# Patient Record
Sex: Female | Born: 1945 | Race: White | Hispanic: Yes | Marital: Married | Smoking: Never smoker
Health system: Southern US, Community
[De-identification: ages and names within clinical notes are randomized; demographics above are authoritative.]

## PROBLEM LIST (undated history)

## (undated) DIAGNOSIS — E119 Type 2 diabetes mellitus without complications: Secondary | ICD-10-CM

---

## 2017-12-07 ENCOUNTER — Emergency Department (HOSPITAL_COMMUNITY): Payer: Medicare (Managed Care) | Admitting: Anesthesiology

## 2017-12-07 ENCOUNTER — Encounter (HOSPITAL_COMMUNITY): Payer: Self-pay

## 2017-12-07 ENCOUNTER — Other Ambulatory Visit: Payer: Self-pay

## 2017-12-07 ENCOUNTER — Emergency Department (HOSPITAL_COMMUNITY): Payer: Medicare (Managed Care)

## 2017-12-07 ENCOUNTER — Encounter (HOSPITAL_COMMUNITY)
Admission: EM | Disposition: A | Discharge status: Home / Self Care | Payer: Self-pay | Source: Home / Self Care | Attending: Emergency Medicine

## 2017-12-07 ENCOUNTER — Ambulatory Visit (HOSPITAL_COMMUNITY)
Admission: EM | Admit: 2017-12-07 | Discharge: 2017-12-07 | Disposition: A | Discharge status: Home / Self Care | Payer: Medicare (Managed Care) | Attending: Emergency Medicine | Admitting: Emergency Medicine

## 2017-12-07 DIAGNOSIS — E119 Type 2 diabetes mellitus without complications: Secondary | ICD-10-CM | POA: Diagnosis not present

## 2017-12-07 DIAGNOSIS — Z7984 Long term (current) use of oral hypoglycemic drugs: Secondary | ICD-10-CM | POA: Insufficient documentation

## 2017-12-07 DIAGNOSIS — I10 Essential (primary) hypertension: Secondary | ICD-10-CM | POA: Insufficient documentation

## 2017-12-07 DIAGNOSIS — W2103XA Struck by baseball, initial encounter: Secondary | ICD-10-CM | POA: Insufficient documentation

## 2017-12-07 DIAGNOSIS — S01511A Laceration without foreign body of lip, initial encounter: Secondary | ICD-10-CM | POA: Diagnosis not present

## 2017-12-07 DIAGNOSIS — Y9382 Activity, spectator at an event: Secondary | ICD-10-CM | POA: Diagnosis not present

## 2017-12-07 DIAGNOSIS — S02401A Maxillary fracture, unspecified, initial encounter for closed fracture: Secondary | ICD-10-CM

## 2017-12-07 DIAGNOSIS — Y929 Unspecified place or not applicable: Secondary | ICD-10-CM | POA: Diagnosis not present

## 2017-12-07 HISTORY — PX: FACIAL LACERATION REPAIR: SHX6589

## 2017-12-07 HISTORY — DX: Type 2 diabetes mellitus without complications: E11.9

## 2017-12-07 LAB — CBC
HEMATOCRIT: 36.7 % (ref 36.0–46.0)
HEMOGLOBIN: 11.6 g/dL — AB (ref 12.0–15.0)
MCH: 28.3 pg (ref 26.0–34.0)
MCHC: 31.6 g/dL (ref 30.0–36.0)
MCV: 89.5 fL (ref 78.0–100.0)
Platelets: 162 10*3/uL (ref 150–400)
RBC: 4.1 MIL/uL (ref 3.87–5.11)
RDW: 13.5 % (ref 11.5–15.5)
WBC: 7.1 10*3/uL (ref 4.0–10.5)

## 2017-12-07 LAB — BASIC METABOLIC PANEL
Anion gap: 10 (ref 5–15)
BUN: 16 mg/dL (ref 8–23)
CHLORIDE: 102 mmol/L (ref 98–111)
CO2: 26 mmol/L (ref 22–32)
CREATININE: 1.02 mg/dL — AB (ref 0.44–1.00)
Calcium: 9.2 mg/dL (ref 8.9–10.3)
GFR calc non Af Amer: 53 mL/min — ABNORMAL LOW (ref >60)
GLUCOSE: 208 mg/dL — AB (ref 70–99)
Potassium: 3.3 mmol/L — ABNORMAL LOW (ref 3.5–5.1)
Sodium: 138 mmol/L (ref 135–145)

## 2017-12-07 LAB — GLUCOSE, CAPILLARY: GLUCOSE-CAPILLARY: 138 mg/dL — AB (ref 70–99)

## 2017-12-07 SURGERY — REPAIR, LACERATION, FACE
Anesthesia: General

## 2017-12-07 MED ORDER — PROPOFOL 10 MG/ML IV BOLUS
INTRAVENOUS | Status: DC | PRN
  Administered 2017-12-07: 20 mg via INTRAVENOUS
  Administered 2017-12-07: 110 mg via INTRAVENOUS

## 2017-12-07 MED ORDER — FENTANYL CITRATE (PF) 250 MCG/5ML IJ SOLN
INTRAMUSCULAR | Status: AC
  Filled 2017-12-07: qty 5

## 2017-12-07 MED ORDER — MIDAZOLAM HCL 5 MG/5ML IJ SOLN
INTRAMUSCULAR | Status: DC | PRN
  Administered 2017-12-07: 2 mg via INTRAVENOUS

## 2017-12-07 MED ORDER — ONDANSETRON HCL 4 MG/2ML IJ SOLN: 4.0000 mg | Freq: Once | INTRAMUSCULAR | Status: DC | PRN

## 2017-12-07 MED ORDER — LACTATED RINGERS IV SOLN
INTRAVENOUS | Status: DC | PRN
  Administered 2017-12-07: 22:00:00 via INTRAVENOUS

## 2017-12-07 MED ORDER — ONDANSETRON HCL 4 MG/2ML IJ SOLN
INTRAMUSCULAR | Status: DC | PRN
  Administered 2017-12-07: 4 mg via INTRAVENOUS

## 2017-12-07 MED ORDER — SUCCINYLCHOLINE CHLORIDE 20 MG/ML IJ SOLN
INTRAMUSCULAR | Status: DC | PRN
  Administered 2017-12-07: 140 mg via INTRAVENOUS

## 2017-12-07 MED ORDER — SUCCINYLCHOLINE CHLORIDE 200 MG/10ML IV SOSY
PREFILLED_SYRINGE | INTRAVENOUS | Status: AC
  Filled 2017-12-07: qty 10

## 2017-12-07 MED ORDER — LIDOCAINE HCL (CARDIAC) PF 100 MG/5ML IV SOSY
PREFILLED_SYRINGE | INTRAVENOUS | Status: DC | PRN
  Administered 2017-12-07: 100 mg via INTRAVENOUS

## 2017-12-07 MED ORDER — HYDROMORPHONE HCL 1 MG/ML IJ SOLN: 0.2500 mg | INTRAMUSCULAR | Status: DC | PRN

## 2017-12-07 MED ORDER — MORPHINE SULFATE (PF) 4 MG/ML IV SOLN
4.0000 mg | INTRAVENOUS | Status: DC | PRN
Start: 2017-12-07 — End: 2017-12-08

## 2017-12-07 MED ORDER — LIDOCAINE-EPINEPHRINE 1 %-1:100000 IJ SOLN
INTRAMUSCULAR | Status: AC
  Filled 2017-12-07: qty 1

## 2017-12-07 MED ORDER — SODIUM CHLORIDE 0.9 % IV SOLN
INTRAVENOUS | Status: DC | PRN
  Administered 2017-12-07: 50 ug/min via INTRAVENOUS

## 2017-12-07 MED ORDER — BACITRACIN ZINC 500 UNIT/GM EX OINT
TOPICAL_OINTMENT | CUTANEOUS | Status: DC | PRN
  Administered 2017-12-07: 1 via TOPICAL

## 2017-12-07 MED ORDER — FENTANYL CITRATE (PF) 100 MCG/2ML IJ SOLN
INTRAMUSCULAR | Status: DC | PRN
  Administered 2017-12-07: 50 ug via INTRAVENOUS
  Administered 2017-12-07: 100 ug via INTRAVENOUS

## 2017-12-07 MED ORDER — CEFAZOLIN SODIUM-DEXTROSE 2-3 GM-%(50ML) IV SOLR
INTRAVENOUS | Status: DC | PRN
  Administered 2017-12-07: 2 g via INTRAVENOUS

## 2017-12-07 MED ORDER — MEPERIDINE HCL 50 MG/ML IJ SOLN: 6.2500 mg | INTRAMUSCULAR | Status: DC | PRN

## 2017-12-07 MED ORDER — LIDOCAINE 2% (20 MG/ML) 5 ML SYRINGE
INTRAMUSCULAR | Status: AC
  Filled 2017-12-07: qty 5

## 2017-12-07 MED ORDER — EPHEDRINE SULFATE 50 MG/ML IJ SOLN
INTRAMUSCULAR | Status: AC
  Filled 2017-12-07: qty 1

## 2017-12-07 MED ORDER — ONDANSETRON HCL 4 MG/2ML IJ SOLN
INTRAMUSCULAR | Status: AC
  Filled 2017-12-07: qty 2

## 2017-12-07 MED ORDER — OXYCODONE HCL 5 MG PO TABS
ORAL_TABLET | ORAL | Status: AC
  Filled 2017-12-07: qty 1

## 2017-12-07 MED ORDER — CEFAZOLIN SODIUM-DEXTROSE 2-4 GM/100ML-% IV SOLN
INTRAVENOUS | Status: AC
  Filled 2017-12-07: qty 100

## 2017-12-07 MED ORDER — EPHEDRINE SULFATE 50 MG/ML IJ SOLN
INTRAMUSCULAR | Status: DC | PRN
  Administered 2017-12-07: 5 mg via INTRAVENOUS

## 2017-12-07 MED ORDER — BACITRACIN ZINC 500 UNIT/GM EX OINT
TOPICAL_OINTMENT | CUTANEOUS | Status: AC
Start: 2017-12-07 — End: ?
  Filled 2017-12-07: qty 28.35

## 2017-12-07 MED ORDER — OXYCODONE HCL 5 MG PO TABS
5.0000 mg | ORAL_TABLET | Freq: Once | ORAL | Status: AC
Start: 2017-12-07 — End: 2017-12-07
  Administered 2017-12-07: 5 mg via ORAL

## 2017-12-07 MED ORDER — MIDAZOLAM HCL 2 MG/2ML IJ SOLN
INTRAMUSCULAR | Status: AC
  Filled 2017-12-07: qty 2

## 2017-12-07 MED ORDER — PROPOFOL 10 MG/ML IV BOLUS
INTRAVENOUS | Status: AC
  Filled 2017-12-07: qty 20

## 2017-12-07 SURGICAL SUPPLY — 40 items
BLADE SURG 15 STRL LF DISP TIS (BLADE) IMPLANT
BLADE SURG 15 STRL SS (BLADE)
CANISTER SUCT 3000ML PPV (MISCELLANEOUS) ×2 IMPLANT
CLEANER TIP ELECTROSURG 2X2 (MISCELLANEOUS) ×2 IMPLANT
CORDS BIPOLAR (ELECTRODE) IMPLANT
COVER SURGICAL LIGHT HANDLE (MISCELLANEOUS) ×2 IMPLANT
DRAPE HALF SHEET 40X57 (DRAPES) ×2 IMPLANT
DRAPE U-SHAPE 76X120 STRL (DRAPES) ×2 IMPLANT
ELECT COATED BLADE 2.86 ST (ELECTRODE) ×2 IMPLANT
ELECT NEEDLE TIP 2.8 STRL (NEEDLE) IMPLANT
ELECT REM PT RETURN 9FT ADLT (ELECTROSURGICAL) ×2
ELECTRODE REM PT RTRN 9FT ADLT (ELECTROSURGICAL) ×1 IMPLANT
EVACUATOR SILICONE 100CC (DRAIN) IMPLANT
GLOVE BIOGEL M 7.0 STRL (GLOVE) ×2 IMPLANT
GOWN STRL REUS W/ TWL LRG LVL3 (GOWN DISPOSABLE) ×2 IMPLANT
GOWN STRL REUS W/TWL LRG LVL3 (GOWN DISPOSABLE) ×2
KIT BASIN OR (CUSTOM PROCEDURE TRAY) ×2 IMPLANT
KIT TURNOVER KIT B (KITS) ×2 IMPLANT
LOCATOR NERVE 3 VOLT (DISPOSABLE) IMPLANT
NEEDLE HYPO 25GX1X1/2 BEV (NEEDLE) IMPLANT
NS IRRIG 1000ML POUR BTL (IV SOLUTION) ×2 IMPLANT
PAD ARMBOARD 7.5X6 YLW CONV (MISCELLANEOUS) ×4 IMPLANT
PENCIL BUTTON HOLSTER BLD 10FT (ELECTRODE) ×2 IMPLANT
SUT CHROMIC 4 0 P 3 18 (SUTURE) ×4 IMPLANT
SUT ETHILON 3 0 PS 1 (SUTURE) IMPLANT
SUT ETHILON 5 0 P 3 18 (SUTURE)
SUT ETHILON 5 0 PS 2 18 (SUTURE) IMPLANT
SUT ETHILON 6 0 P 1 (SUTURE) ×2 IMPLANT
SUT NYLON ETHILON 5-0 P-3 1X18 (SUTURE) IMPLANT
SUT PLAIN 5 0 P 3 18 (SUTURE) IMPLANT
SUT SILK 2 0 PERMA HAND 18 BK (SUTURE) IMPLANT
SUT SILK 3 0 REEL (SUTURE) IMPLANT
SUT VIC AB 3-0 PS2 18 (SUTURE)
SUT VIC AB 3-0 PS2 18XBRD (SUTURE) IMPLANT
SUT VIC AB 4-0 P-3 18X BRD (SUTURE) ×1 IMPLANT
SUT VIC AB 4-0 P3 18 (SUTURE) ×1
SUT VICRYL ABS 5-0 PS5 18IN (SUTURE) IMPLANT
TOWEL OR 17X24 6PK STRL BLUE (TOWEL DISPOSABLE) ×2 IMPLANT
TRAY ENT MC OR (CUSTOM PROCEDURE TRAY) ×2 IMPLANT
WATER STERILE IRR 1000ML POUR (IV SOLUTION) ×2 IMPLANT

## 2017-12-07 NOTE — Discharge Instructions (Signed)
Laceration Care, Adult A laceration is a cut that goes through all of the layers of the skin and into the tissue that is right under the skin. Some lacerations heal on their own. Others need to be closed with stitches (sutures), staples, skin adhesive strips, or skin glue. Proper laceration care minimizes the risk of infection and helps the laceration to heal better. How is this treated? If sutures or staples were used:  Keep the wound clean and dry.  If you were given a bandage (dressing), you should change it at least one time per day or as told by your health care provider. You should also change it if it becomes wet or dirty.  Keep the wound completely dry for the first 24 hours or as told by your health care provider. After that time, you may shower or bathe. However, make sure that the wound is not soaked in water until after the sutures or staples have been removed.  Clean the wound one time each day or as told by your health care provider: ? Wash the wound with soap and water. ? Rinse the wound with water to remove all soap. ? Pat the wound dry with a clean towel. Do not rub the wound.  After cleaning the wound, apply a thin layer of antibiotic ointmentas told by your health care provider. This will help to prevent infection and keep the dressing from sticking to the wound.  Have the sutures or staples removed as told by your health care provider. If skin adhesive strips were used:  Keep the wound clean and dry.  If you were given a bandage (dressing), you should change it at least one time per day or as told by your health care provider. You should also change it if it becomes dirty or wet.  Do not get the skin adhesive strips wet. You may shower or bathe, but be careful to keep the wound dry.  If the wound gets wet, pat it dry with a clean towel. Do not rub the wound.  Skin adhesive strips fall off on their own. You may trim the strips as the wound heals. Do not remove  skin adhesive strips that are still stuck to the wound. They will fall off in time. If skin glue was used:  Try to keep the wound dry, but you may briefly wet it in the shower or bath. Do not soak the wound in water, such as by swimming.  After you have showered or bathed, gently pat the wound dry with a clean towel. Do not rub the wound.  Do not do any activities that will make you sweat heavily until the skin glue has fallen off on its own.  Do not apply liquid, cream, or ointment medicine to the wound while the skin glue is in place. Using those may loosen the film before the wound has healed.  If you were given a bandage (dressing), you should change it at least one time per day or as told by your health care provider. You should also change it if it becomes dirty or wet.  If a dressing is placed over the wound, be careful not to apply tape directly over the skin glue. Doing that may cause the glue to be pulled off before the wound has healed.  Do not pick at the glue. The skin glue usually remains in place for 5-10 days, then it falls off of the skin. General Instructions  Take over-the-counter and prescription medicines  only as told by your health care provider.  If you were prescribed an antibiotic medicine or ointment, take or apply it as told by your doctor. Do not stop using it even if your condition improves.  To help prevent scarring, make sure to cover your wound with sunscreen whenever you are outside after stitches are removed, after adhesive strips are removed, or when glue remains in place and the wound is healed. Make sure to wear a sunscreen of at least 30 SPF.  Do not scratch or pick at the wound.  Keep all follow-up visits as told by your health care provider. This is important.  Check your wound every day for signs of infection. Watch for: ? Redness, swelling, or pain. ? Fluid, blood, or pus.  Raise (elevate) the injured area above the level of your heart while  you are sitting or lying down, if possible. Contact a health care provider if:  You received a tetanus shot and you have swelling, severe pain, redness, or bleeding at the injection site.  You have a fever.  A wound that was closed breaks open.  You notice a bad smell coming from your wound or your dressing.  You notice something coming out of the wound, such as wood or glass.  Your pain is not controlled with medicine.  You have increased redness, swelling, or pain at the site of your wound.  You have fluid, blood, or pus coming from your wound.  You notice a change in the color of your skin near your wound.  You need to change the dressing frequently due to fluid, blood, or pus draining from the wound.  You develop a new rash.  You develop numbness around the wound. Get help right away if:  You develop severe swelling around the wound.  Your pain suddenly increases and is severe.  You develop painful lumps near the wound or on skin that is anywhere on your body.  You have a red streak going away from your wound.  The wound is on your hand or foot and you cannot properly move a finger or toe.  The wound is on your hand or foot and you notice that your fingers or toes look pale or bluish. This information is not intended to replace advice given to you by your health care provider. Make sure you discuss any questions you have with your health care provider. Document Released: 04/28/2005 Document Revised: 09/28/2015 Document Reviewed: 04/24/2014 Elsevier Interactive Patient Education  2018 [employer].  Post Anesthesia Home Care Instructions  Activity: Get plenty of rest for the remainder of the day. A responsible individual must stay with you for 24 hours following the procedure.  For the next 24 hours, DO NOT: -Drive a car -Advertising copywriter -Drink alcoholic beverages -Take any medication unless instructed by your physician -Make any legal decisions or sign  important papers.  Meals: Start with liquid foods such as gelatin or soup. Progress to regular foods as tolerated. Avoid greasy, spicy, heavy foods. If nausea and/or vomiting occur, drink only clear liquids until the nausea and/or vomiting subsides. Call your physician if vomiting continues.  Special Instructions/Symptoms: Your throat may feel dry or sore from the anesthesia or the breathing tube placed in your throat during surgery. If this causes discomfort, gargle with warm salt water. The discomfort should disappear within 24 hours.  If you had a scopolamine patch placed behind your ear for the management of post- operative nausea and/or vomiting:  1. The medication  in the patch is effective for 72 hours, after which it should be removed.  Wrap patch in a tissue and discard in the trash. Wash hands thoroughly with soap and water. 2. You may remove the patch earlier than 72 hours if you experience unpleasant side effects which may include dry mouth, dizziness or visual disturbances. 3. Avoid touching the patch. Wash your hands with soap and water after contact with the patch.

## 2017-12-07 NOTE — ED Triage Notes (Signed)
Patient here by EMS for a facial injury with a baseball.  Patient was hit in the lip by the ball.  Bleeding controlled with gauze.  No LOC.

## 2017-12-07 NOTE — Anesthesia Preprocedure Evaluation (Addendum)
Anesthesia Evaluation  Patient identified by MRN, date of birth, ID band Patient awake    Reviewed: Allergy & Precautions, NPO status , Patient's Chart, lab work & pertinent test results  Airway Mallampati: I  TM Distance: >3 FB Neck ROM: Full    Dental  (+) Teeth Intact, Dental Advisory Given,  Upper right back molar broken but in place per patient:   Pulmonary    Pulmonary exam normal        Cardiovascular hypertension, Pt. on medications Normal cardiovascular exam     Neuro/Psych    GI/Hepatic   Endo/Other  diabetes, Type 2, Oral Hypoglycemic Agents  Renal/GU      Musculoskeletal   Abdominal   Peds  Hematology   Anesthesia Other Findings   Reproductive/Obstetrics                            Anesthesia Physical Anesthesia Plan  ASA: II  Anesthesia Plan: General   Post-op Pain Management:    Induction: Intravenous, Rapid sequence and Cricoid pressure planned  PONV Risk Score and Plan: 3 and Ondansetron and Treatment may vary due to age or medical condition  Airway Management Planned: Oral ETT  Additional Equipment:   Intra-op Plan:   Post-operative Plan: Extubation in OR  Informed Consent: I have reviewed the patients History and Physical, chart, labs and discussed the procedure including the risks, benefits and alternatives for the proposed anesthesia with the patient or authorized representative who has indicated his/her understanding and acceptance.     Plan Discussed with: CRNA and Surgeon  Anesthesia Plan Comments:         Anesthesia Quick Evaluation

## 2017-12-07 NOTE — Transfer of Care (Signed)
Immediate Anesthesia Transfer of Care Note  Patient: Carlisa Guilmette  Procedure(s) Performed: COMPLEX LIP LACERATION REPAIR (N/A )  Patient Location: PACU  Anesthesia Type:General  Level of Consciousness: drowsy  Airway & Oxygen Therapy: Patient Spontanous Breathing and Patient connected to nasal cannula oxygen  Post-op Assessment: Report given to RN and Post -op Vital signs reviewed and stable  Post vital signs: Reviewed and stable  Last Vitals:  Vitals Value Taken Time  BP 133/67 12/07/2017 10:58 PM  Temp    Pulse 109 12/07/2017 10:59 PM  Resp 19 12/07/2017 10:59 PM  SpO2 97 % 12/07/2017 10:59 PM  Vitals shown include unvalidated device data.  Last Pain:  Vitals:   12/07/17 1953  TempSrc: Axillary         Complications: No apparent anesthesia complications. Patient denies pain.

## 2017-12-07 NOTE — Op Note (Signed)
Operative Note: CLOSURE OF COMPLEX LIP LACERATION  Patient: Tammy Pruitt  Medical record number: [id number]  Date:12/07/2017  Pre-operative Indications: 4 cm complex right upper lip laceration  Postoperative Indications: Same  Surgical Procedure: Debridement and closure of complex lip laceration  Anesthesia: GET  Surgeon: Barbee Coughavid L Edia Pursifull, M.D.  Assist: None  Complications: None  EBL: Minimal   Brief History: The patient is a 72 y.o. female with a history of significant facial injury, patient struck in the face by a baseball on the evening of admission.  She suffered a severe complex laceration of the right upper lip and nondisplaced maxillary alveolar fractures. Given the patient's history and findings I recommended debridement and closure of lip lacerations under general anesthesia, risks and benefits were discussed in detail with the patient and her family. They understand and agree with our plan for [facility] on an emergent basis.  Surgical Procedure: The patient is brought to the operating room on 12/07/2017 and placed in supine position on the operating table. General endotracheal anesthesia was established without difficulty. When the patient was adequately anesthetized, surgical timeout was performed and correct identification of the patient and the surgical procedure. The patient was positioned and prepped and draped in sterile fashion.  The patient's oral cavity was thoroughly examined, she is found to have a complex lip laceration involving the right upper lip measuring more than 4 cm and extending from the anterior soft tissue through the lip musculature and into the oral cavity.  No other intraoral lacerations or trauma were noted.  The patient's oral cavity was irrigated and orogastric tube was passed to reduce the risk of postoperative nausea associated with swallowed blood.  The patient's lip closure was then undertaken with  debridement of devitalized tissue.  The patient had a complex extensive laceration and initial closure was begun on the medial mucosal surface of the lip with interrupted 4-0 chromic sutures.  The orbicularis oris musculature was then reapproximated with interrupted 4-0 Vicryl sutures.  The anterior mucosal surface of the lip was then closed with interrupted 4-0 chromic sutures.  The facial skin with extended laceration superiorly above the vermilion border was closed with interrupted 6-0 Ethilon suture.  An orogastric tube was passed and stomach contents were aspirated. Patient was awakened from anesthetic and transferred from the operating room to the recovery room in stable condition. There were no complications and blood loss was minimal.   Barbee Coughavid L Tiasha Helvie, M.D. [facility] 12/07/2017

## 2017-12-07 NOTE — ED Provider Notes (Signed)
[facility] Provider Note   CSN: [id number] Arrival date & time: 12/07/17  1946     History   Chief Complaint Chief Complaint  Patient presents with  . Facial Injury    HPI Tammy Pruitt is a 72 y.o. female.  HPI Patient is a 72 year old female who was struck in the face at a local baseball game by the foul ball.  She presents with complex facial and lip laceration.  No use of anticoagulants.  Pain is moderate in severity.  No headache.  No loss consciousness.  No trismus.  She has partial upper and lower dentures.  Past Medical History:  Diagnosis Date  . Diabetes mellitus without complication (HCC)     There are no active problems to display for this patient.   History reviewed. No pertinent surgical history.   OB History   None      Home Medications    Prior to Admission medications   Not on File    Family History History reviewed. No pertinent family history.  Social History Social History   Tobacco Use  . Smoking status: Never Smoker  . Smokeless tobacco: Never Used  Substance Use Topics  . Alcohol use: Not on file  . Drug use: Not on file     Allergies   Patient has no known allergies.   Review of Systems Review of Systems  All other systems reviewed and are negative.    Physical Exam Updated Vital Signs BP (!) 148/79   Pulse (!) 120   Temp 98.9 F (37.2 C) (Axillary)   Resp 20   SpO2 98%   Physical Exam  Constitutional: She is oriented to person, place, and time. She appears well-developed and well-nourished. No distress.  HENT:  Head: Normocephalic.  Complex right upper lip laceration.  Tenderness of the maxilla without obvious instability.  No trismus.  No active bleeding.  Eyes: EOM are normal.  Neck: Normal range of motion.  Cardiovascular: Normal rate and regular rhythm.  Pulmonary/Chest: Effort normal and breath sounds normal.  Abdominal: Soft. She exhibits no distension. There  is no tenderness.  Musculoskeletal: Normal range of motion.  Neurological: She is alert and oriented to person, place, and time.  Skin: Skin is warm and dry.  Psychiatric: She has a normal mood and affect. Judgment normal.  Nursing note and vitals reviewed.    ED Treatments / Results  Labs (all labs ordered are listed, but only abnormal results are displayed) Labs Reviewed  CBC - Abnormal; Notable for the following components:      Result Value   Hemoglobin 11.6 (*)    All other components within normal limits  BASIC METABOLIC PANEL - Abnormal; Notable for the following components:   Potassium 3.3 (*)    Glucose, Bld 208 (*)    Creatinine, Ser 1.02 (*)    GFR calc non Af Amer 53 (*)    All other components within normal limits  GLUCOSE, CAPILLARY - Abnormal; Notable for the following components:   Glucose-Capillary 138 (*)    All other components within normal limits    EKG None  Radiology Dg Chest Portable 1 View  Result Date: 12/07/2017 CLINICAL DATA:  Preop EXAM: PORTABLE CHEST 1 VIEW COMPARISON:  None. FINDINGS: Lungs are clear.  No pleural effusion or pneumothorax. The heart is normal in size. IMPRESSION: No evidence of acute cardiopulmonary disease. Electronically Signed   By: Charline BillsSriyesh  Krishnan M.D.   On: 12/07/2017 20:52  Ct Maxillofacial Wo Contrast  Result Date: 12/07/2017 CLINICAL DATA:  Facial injury.  Hit in face with a baseball. EXAM: CT MAXILLOFACIAL WITHOUT CONTRAST TECHNIQUE: Multidetector CT imaging of the maxillofacial structures was performed. Multiplanar CT image reconstructions were also generated. COMPARISON:  None. FINDINGS: Osseous: BILATERAL maxillary sinus fractures are present. Both lateral walls display small breaks, with air in the infratemporal fossa, concentrated in the retroantral fat. There are BILATERAL anterior maxilla fractures, but they do not extend across the midline into the nasal cavity, therefore the fractures do not represent a LeFort  1 fracture. No TMJ dislocation. 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 No mandibular fracture. Poor dentition, but no definite posttraumatic missing teeth. Orbits: Orbital floors are intact. No orbital emphysema. No downward herniation of fat or entrapment. No postseptal hematoma. Both globes are intact. Sinuses: BILATERAL maxillary hemosinus. The ethmoid, frontal, and sphenoid sinuses are clear. Nasal septum is bowed RIGHT-to-LEFT, 3 mm. BILATERAL ostiomeatal unit obstruction. Nasal bone is intact. Soft tissues: There is soft tissue swelling over the lips, with a RIGHT-sided laceration. There is no foreign body. Limited intracranial: Negative intracranial compartment. IMPRESSION: BILATERAL maxillary sinus fractures involve the lateral and anterior portions of the sinuses, with infratemporal fossa air, and BILATERAL hemosinus. There is no complete maxillary sinus dysjunction to suggest LeFort 1 fracture. There is no blowout injury. No orbital findings of significance. Marked facial soft tissue swelling. Negative intracranial compartment. Electronically Signed   By: Elsie Stain M.D.   On: 12/07/2017 21:19    Procedures Procedures (including critical care time)  Medications Ordered in ED Medications  morphine 4 MG/ML injection 4 mg (has no administration in time range)  ceFAZolin (ANCEF) 2-4 GM/100ML-% IVPB (has no administration in time range)     Initial Impression / Assessment and Plan / ED Course  I have reviewed the triage vital signs and the nursing notes.  Pertinent labs & imaging results that were available during my care of the patient were reviewed by me and considered in my medical decision making (see chart for details).     Complex lip laceration.  This will require operative management given its complex of the and need for cosmetic outcome.  CT imaging demonstrates nondisplaced maxilla fracture and bilateral maxillary sinus fractures.  Otolaryngology: Dr. Annalee Genta to take to  OR  Final Clinical Impressions(s) / ED Diagnoses   Final diagnoses:  Complicated laceration of lip    ED Discharge Orders    None       Azalia Bilis, MD 12/07/17 2335

## 2017-12-07 NOTE — Anesthesia Procedure Notes (Signed)
Procedure Name: Intubation Date/Time: 12/07/2017 9:50 PM Performed by: Edmonia CaprioAuston, Robertlee Rogacki M, CRNA Pre-anesthesia Checklist: Patient identified, Emergency Drugs available, Suction available, Patient being monitored and Timeout performed Patient Re-evaluated:Patient Re-evaluated prior to induction Oxygen Delivery Method: Circle system utilized Preoxygenation: Pre-oxygenation with 100% oxygen Induction Type: IV induction, Rapid sequence and Cricoid Pressure applied Laryngoscope Size: Miller and 2 Grade View: Grade I Tube type: Oral Tube size: 7.0 mm Number of attempts: 1 Airway Equipment and Method: Stylet Placement Confirmation: ETT inserted through vocal cords under direct vision,  breath sounds checked- equal and bilateral and positive ETCO2 Secured at: 22 cm Tube secured with: Tape Dental Injury: Teeth and Oropharynx as per pre-operative assessment

## 2017-12-07 NOTE — H&P (Signed)
Tammy Pruitt is an 72 y.o. female.   Chief Complaint: Complex upper lip laceration HPI: Patient presents to the [facility] emergency department after being struck in the face by a baseball on the evening of admission.  She had a moderate amount of bleeding and a broken upper denture plate.  She was seen by the ER physician and a CT scan of the head was obtained.  No loss of consciousness.  No history of prior trauma.  Complains of pain in the upper mouth and face.  Past Medical History:  Diagnosis Date  . Diabetes mellitus without complication ([place])     History reviewed. No pertinent surgical history.  History reviewed. No pertinent family history. Social History:  reports that she has never smoked. She has never used smokeless tobacco. Her alcohol and drug histories are not on file.  Allergies: No Known Allergies   (Not in a hospital admission)  Results for orders placed or performed during the hospital encounter of 12/07/17 (from the past 48 hour(s))  CBC     Status: Abnormal   Collection Time: 12/07/17  7:57 PM  Result Value Ref Range   WBC 7.1 4.0 - 10.5 K/uL   RBC 4.10 3.87 - 5.11 MIL/uL   Hemoglobin 11.6 (L) 12.0 - 15.0 g/dL   HCT 36.7 36.0 - 46.0 %   MCV 89.5 78.0 - 100.0 fL   MCH 28.3 26.0 - 34.0 pg   MCHC 31.6 30.0 - 36.0 g/dL   RDW 13.5 11.5 - 15.5 %   Platelets 162 150 - 400 K/uL    Comment: Performed at [facility], [place] [address] [place], [place] [address]  Basic metabolic panel     Status: Abnormal   Collection Time: 12/07/17  7:57 PM  Result Value Ref Range   Sodium 138 135 - 145 mmol/L   Potassium 3.3 (L) 3.5 - 5.1 mmol/L   Chloride 102 98 - 111 mmol/L   CO2 26 22 - 32 mmol/L   Glucose, Bld 208 (H) 70 - 99 mg/dL   BUN 16 8 - 23 mg/dL   Creatinine, Ser 1.02 (H) 0.44 - 1.00 mg/dL   Calcium 9.2 8.9 - 10.3 mg/dL   GFR calc non Af Amer 53 (L) >60 mL/min   GFR calc Af Amer >60 >60 mL/min    Comment: (NOTE) The eGFR has been calculated using  the CKD EPI equation. This calculation has not been validated in all clinical situations. eGFR's persistently <60 mL/min signify possible Chronic Kidney Disease.    Anion gap 10 5 - 15    Comment: Performed at [facility] [address], [place], [place] [address]   Dg Chest Portable 1 View  Result Date: 12/07/2017 CLINICAL DATA:  Preop EXAM: PORTABLE CHEST 1 VIEW COMPARISON:  None. FINDINGS: Lungs are clear.  No pleural effusion or pneumothorax. The heart is normal in size. IMPRESSION: No evidence of acute cardiopulmonary disease. Electronically Signed   By: Julian Hy M.D.   On: 12/07/2017 20:52   Ct Maxillofacial Wo Contrast  Result Date: 12/07/2017 CLINICAL DATA:  Facial injury.  Hit in face with a baseball. EXAM: CT MAXILLOFACIAL WITHOUT CONTRAST TECHNIQUE: Multidetector CT imaging of the maxillofacial structures was performed. Multiplanar CT image reconstructions were also generated. COMPARISON:  None. FINDINGS: Osseous: BILATERAL maxillary sinus fractures are present. Both lateral walls display small breaks, with air in the infratemporal fossa, concentrated in the retroantral fat. There are BILATERAL anterior maxilla fractures, but they  do not extend across the midline into the nasal cavity, therefore the fractures do not represent a LeFort 1 fracture. No TMJ dislocation. 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 No mandibular fracture. Poor dentition, but no definite posttraumatic missing teeth. Orbits: Orbital floors are intact. No orbital emphysema. No downward herniation of fat or entrapment. No postseptal hematoma. Both globes are intact. Sinuses: BILATERAL maxillary hemosinus. The ethmoid, frontal, and sphenoid sinuses are clear. Nasal septum is bowed RIGHT-to-LEFT, 3 mm. BILATERAL ostiomeatal unit obstruction. Nasal bone is intact. Soft tissues: There is soft tissue swelling over the lips, with a RIGHT-sided laceration. There is no foreign body. Limited intracranial: Negative  intracranial compartment. IMPRESSION: BILATERAL maxillary sinus fractures involve the lateral and anterior portions of the sinuses, with infratemporal fossa air, and BILATERAL hemosinus. There is no complete maxillary sinus dysjunction to suggest LeFort 1 fracture. There is no blowout injury. No orbital findings of significance. Marked facial soft tissue swelling. Negative intracranial compartment. Electronically Signed   By: Staci Righter M.D.   On: 12/07/2017 21:19    Review of Systems  Constitutional: Negative.   HENT: Negative.     Blood pressure (!) 148/79, pulse (!) 120, temperature 98.9 F (37.2 C), temperature source Axillary, resp. rate 20, SpO2 98 %. Physical Exam  Constitutional: She appears well-developed and well-nourished.  HENT:  Complex right upper lip laceration extending superficial to intraoral with transection of the orbicularis oris musculature.  Total length approximately 4 cm.  Small amount of blood in the oral cavity, maxilla is stable, no abnormal mobility.  No evidence of trismus.  No palpable fracture.  Neck: Normal range of motion. Neck supple.     Assessment/Plan The patient presents to the [facility] emergency department after being struck in the face by a baseball on the evening of admission.  The patient has a complex right upper lip laceration as outlined above. Maxillofacial CT scan reviewed in detail.  She also has nondisplaced maxillary alveolar fractures which do not extend to the midline.  The maxilla is stable.  Should not require any surgical intervention.  Plan complex closure and reconstruction of her lip laceration.  Patient will be discharged home to operatively.   Alfretta Pinch, MD 12/07/2017, 9:33 PM

## 2017-12-08 ENCOUNTER — Encounter (HOSPITAL_COMMUNITY): Payer: Self-pay | Admitting: Otolaryngology

## 2017-12-09 NOTE — Anesthesia Postprocedure Evaluation (Signed)
Anesthesia Post Note  Patient: Games developerCarmela Orner  Procedure(s) Performed: COMPLEX LIP LACERATION REPAIR (N/A )     Patient location during evaluation: PACU Anesthesia Type: General Level of consciousness: awake and alert Pain management: pain level controlled Vital Signs Assessment: post-procedure vital signs reviewed and stable Respiratory status: spontaneous breathing, nonlabored ventilation, respiratory function stable and patient connected to nasal cannula oxygen Cardiovascular status: blood pressure returned to baseline and stable Postop Assessment: no apparent nausea or vomiting Anesthetic complications: no    Last Vitals:  Vitals:   12/07/17 2328 12/07/17 2331  BP: (!) 115/99 132/74  Pulse: (!) 110 (!) 110  Resp: 18 11  Temp: 36.9 C   SpO2: 96% 96%    Last Pain:  Vitals:   12/07/17 2325  TempSrc:   PainSc: 4                  Malak Duchesneau DAVID

## 2019-06-23 IMAGING — CT CT MAXILLOFACIAL W/O CM
3 series · 15 of 47 positions shown, 18 images · non-contrast
Comparison: None.

CLINICAL DATA: Facial injury.  Hit in face with a baseball.

EXAM:
CT MAXILLOFACIAL WITHOUT CONTRAST
TECHNIQUE: Multidetector CT imaging of the maxillofacial structures was
performed. Multiplanar CT image reconstructions were also generated.

[Series 3: facial/ orbits 2.0 h30s · axial · 0.40mm/px · z∈[-226,-84]mm · 9 of 83 slices shown, 12 images]
[im 6/83  brain]
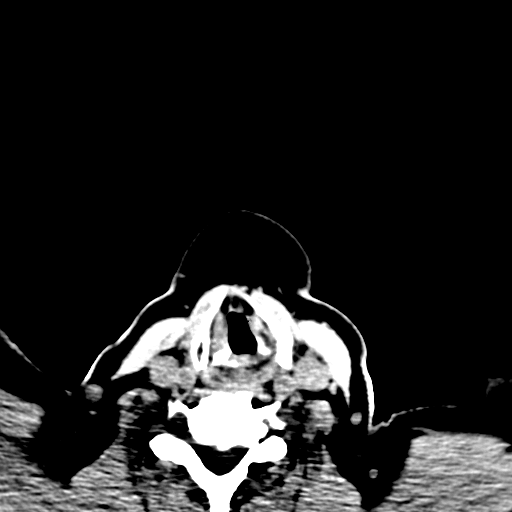
[im 6/83  bone]
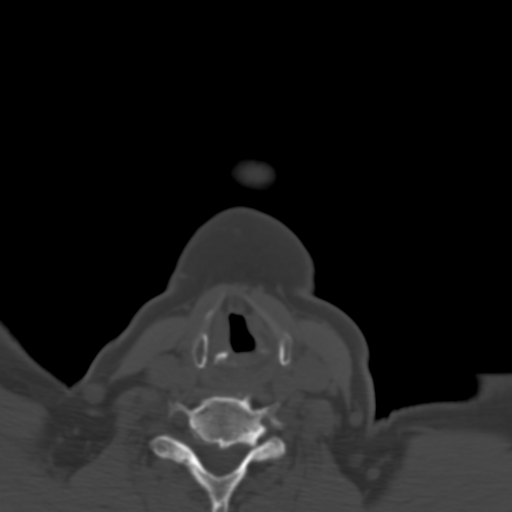
[im 15/83  bone]
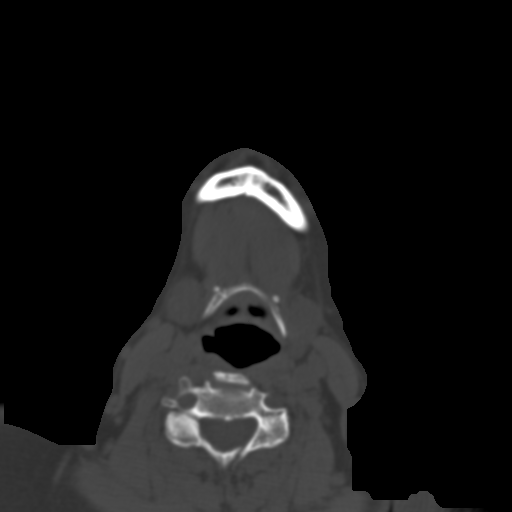
[im 23/83  bone]
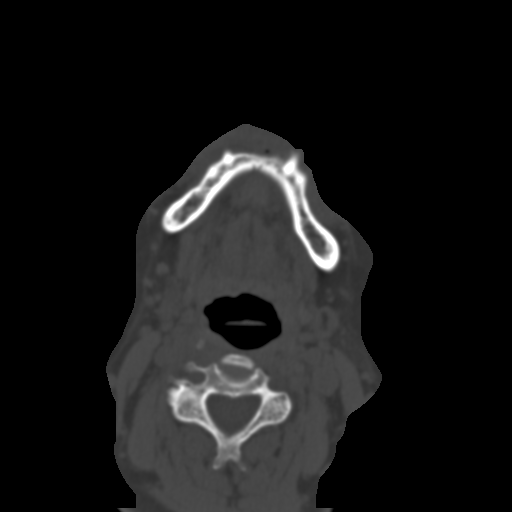
[im 32/83  bone]
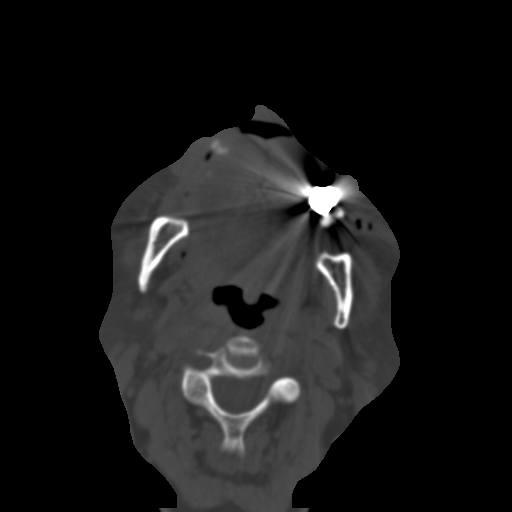
[im 43/83  brain]
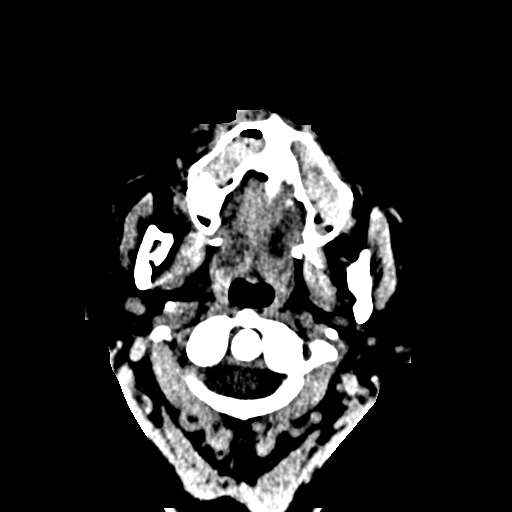
[im 43/83  bone]
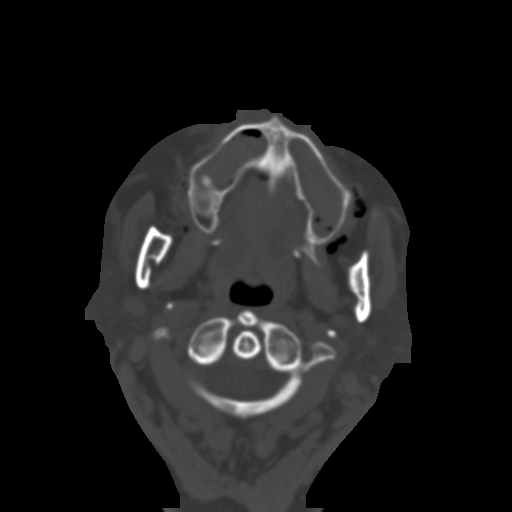
[im 51/83  bone]
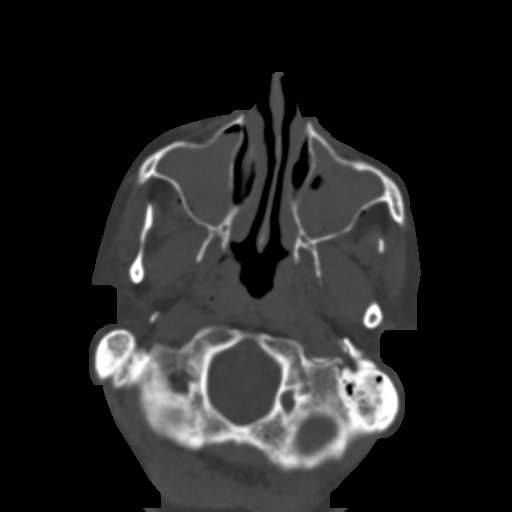
[im 60/83  bone]
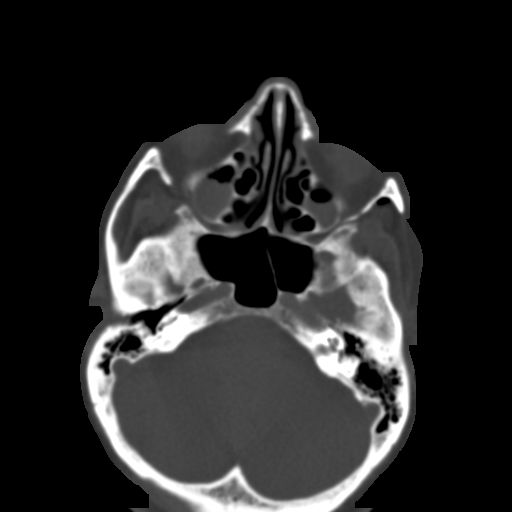
[im 68/83  bone]
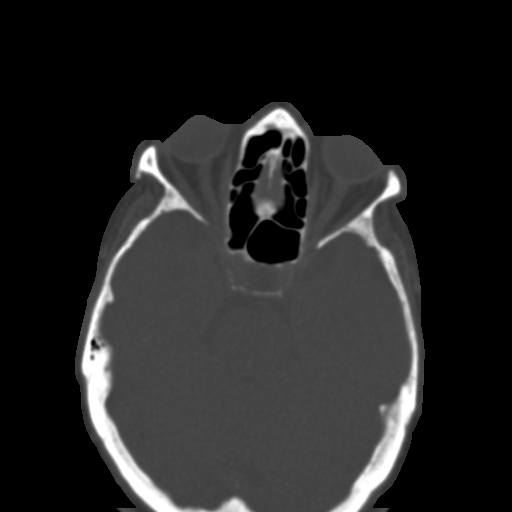
[im 77/83  brain]
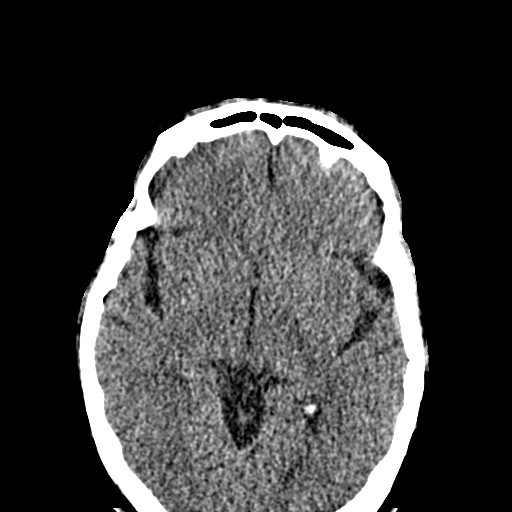
[im 77/83  bone]
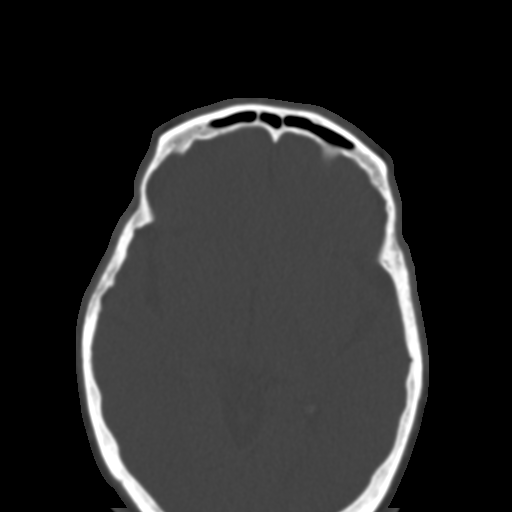

[Series 7: coronal soft tissue · coronal · 0.34mm/px · 3 of 104 slices shown]
[im 35/104  bone]
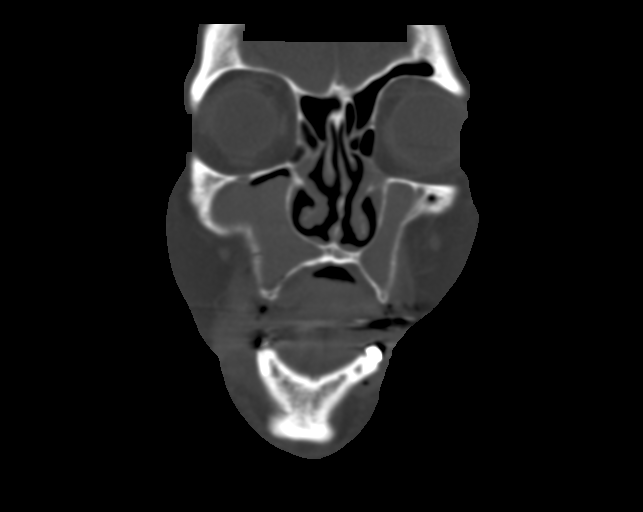
[im 46/104  bone]
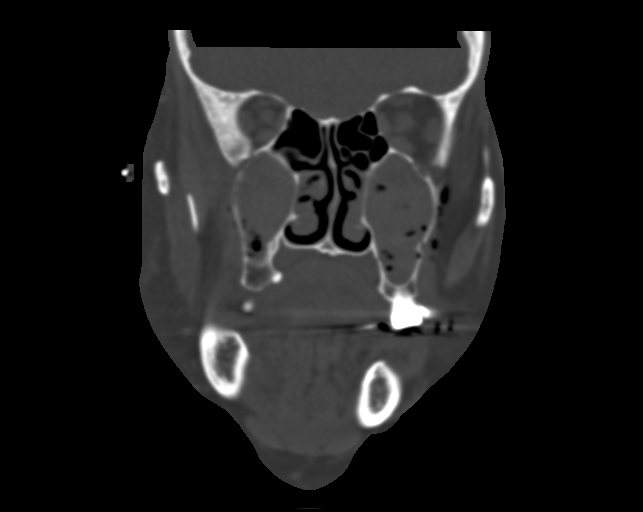
[im 58/104  bone]
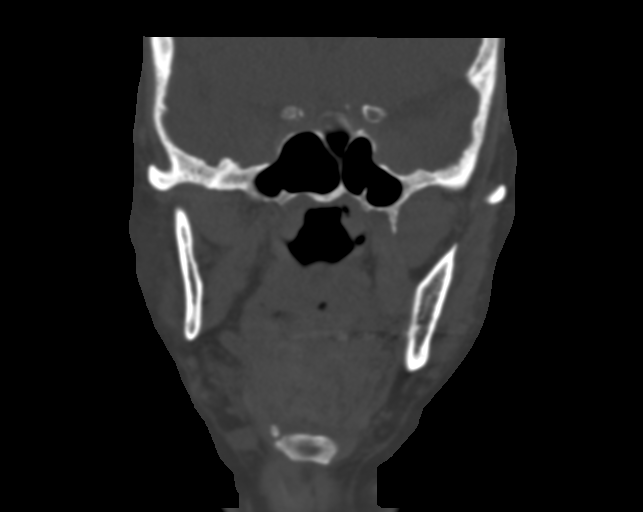

[Series 8: sagittal soft tissue · sagittal · 0.36mm/px · 3 of 99 slices shown]
[im 33/99  bone]
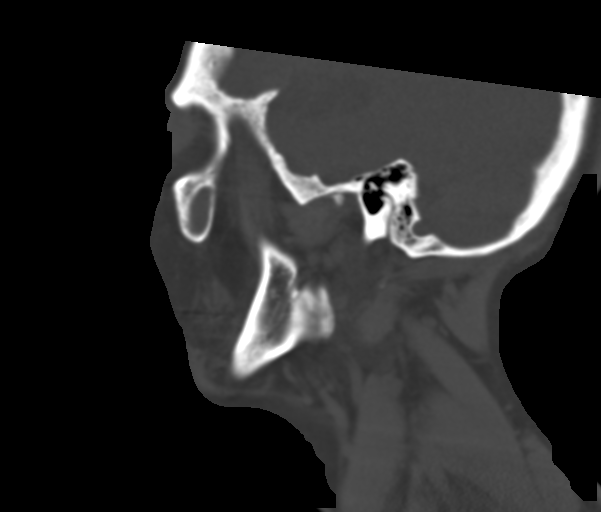
[im 50/99  bone]
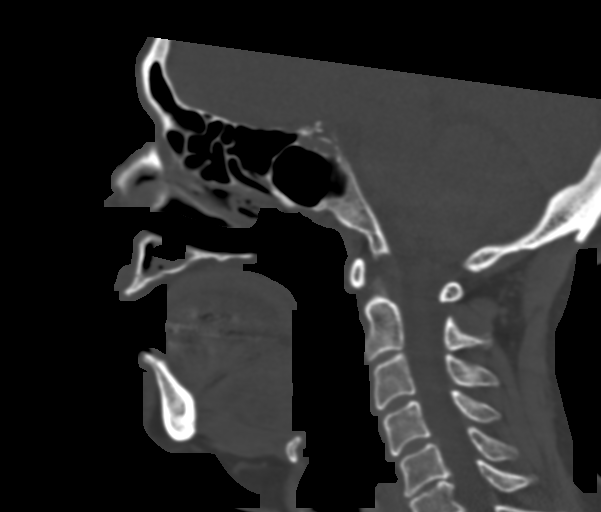
[im 66/99  bone]
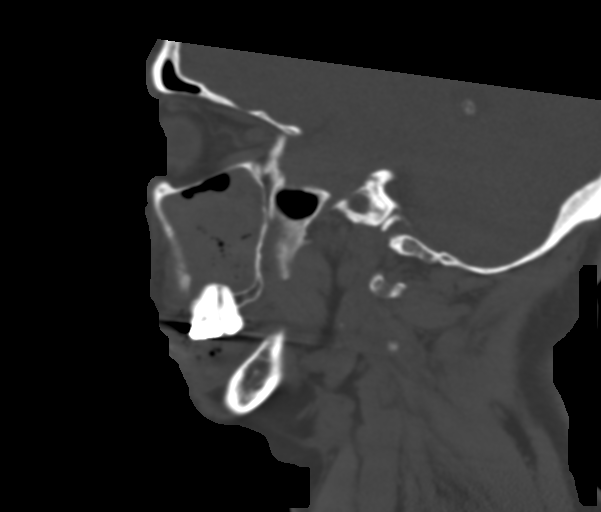

[15 of 47 positions shown; findings below may reference images not displayed]

FINDINGS: Osseous: BILATERAL maxillary sinus fractures are present. Both
lateral walls display small breaks, with air in the infratemporal
fossa, concentrated in the retroantral fat. There are BILATERAL
anterior maxilla fractures, but they do not extend across the
midline into the nasal cavity, therefore the fractures do not
represent Delvin Dellietubie Kangala 1 fracture. No TMJ dislocation. Severe TMJ
arthritis on the RIGHT. No mandibular fracture. Poor dentition, but
no definite posttraumatic missing teeth.

Orbits: Orbital floors are intact. No orbital emphysema. No downward
herniation of fat or entrapment. No postseptal hematoma. Both globes
are intact.

Sinuses: BILATERAL maxillary hemosinus. The ethmoid, frontal, and
sphenoid sinuses are clear. Nasal septum is bowed RIGHT-to-LEFT, 3
mm. BILATERAL ostiomeatal unit obstruction. Nasal bone is intact.

Soft tissues: There is soft tissue swelling over the lips, with a
RIGHT-sided laceration. There is no foreign body.

Limited intracranial: Negative intracranial compartment.
IMPRESSION: BILATERAL maxillary sinus fractures involve the lateral and anterior
portions of the sinuses, with infratemporal fossa air, and BILATERAL
hemosinus.

There is no complete maxillary sinus dysjunction to suggest Bert 1
fracture.

There is no blowout injury. No orbital findings of significance.

Marked facial soft tissue swelling.

Negative intracranial compartment.

## 2019-06-23 IMAGING — DX DG CHEST 1V PORT
1 series · 1 of 1 positions shown · non-contrast
Comparison: None.

CLINICAL DATA: Preop

EXAM:
PORTABLE CHEST 1 VIEW

[chest]
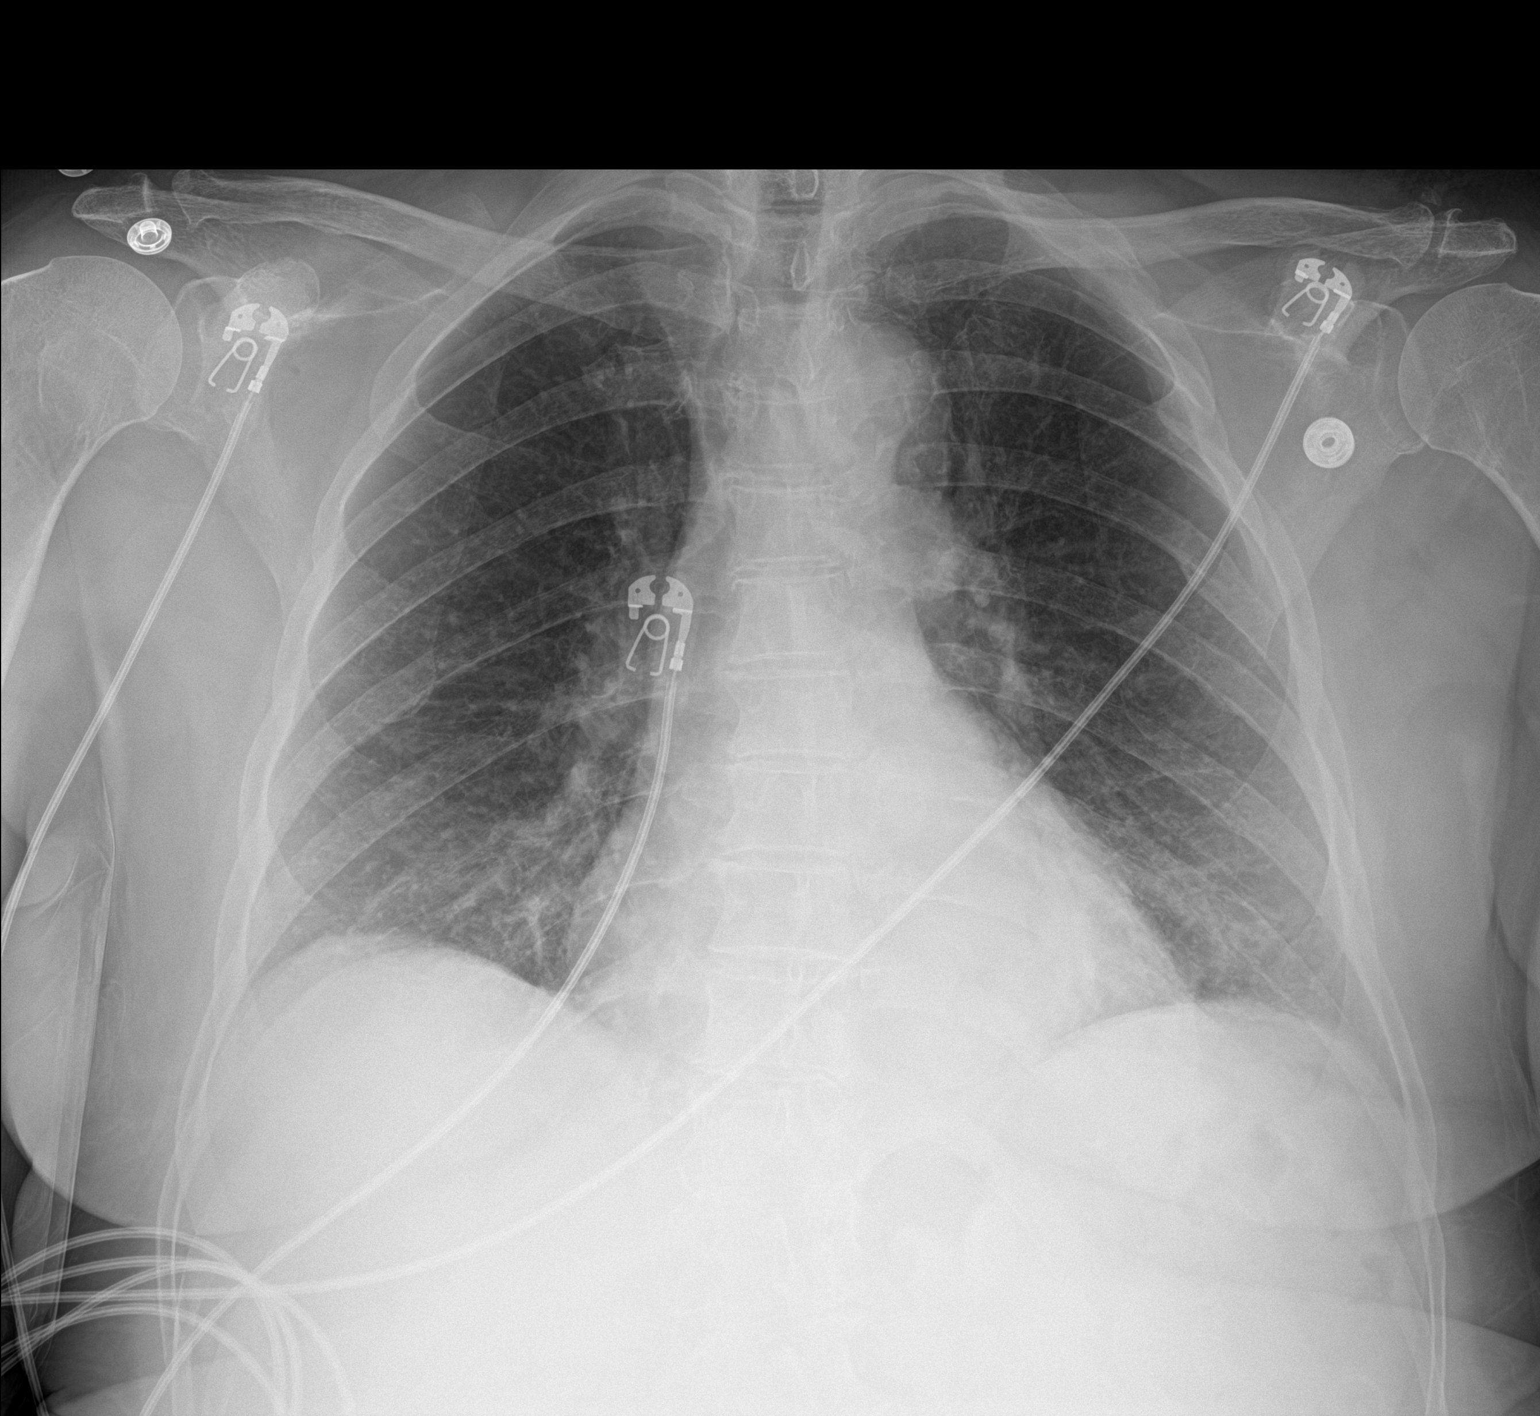

[1 of 1 positions shown; findings below may reference images not displayed]

FINDINGS: Lungs are clear.  No pleural effusion or pneumothorax.

The heart is normal in size.
IMPRESSION: No evidence of acute cardiopulmonary disease.
